# Patient Record
Sex: Male | Born: 1990 | Race: Black or African American | Marital: Single | Smoking: Never smoker
Health system: Southern US, Community
[De-identification: ages and names within clinical notes are randomized; demographics above are authoritative.]

---

## 2014-01-13 ENCOUNTER — Other Ambulatory Visit: Payer: Self-pay | Admitting: Family Medicine

## 2014-01-13 DIAGNOSIS — M84374D Stress fracture, right foot, subsequent encounter for fracture with routine healing: Secondary | ICD-10-CM

## 2014-01-13 DIAGNOSIS — M79671 Pain in right foot: Secondary | ICD-10-CM

## 2014-01-15 ENCOUNTER — Ambulatory Visit
Admission: RE | Admit: 2014-01-15 | Discharge: 2014-01-15 | Disposition: A | Discharge status: Home / Self Care | Payer: BC Managed Care – PPO | Source: Ambulatory Visit | Attending: Family Medicine | Admitting: Family Medicine

## 2014-01-15 DIAGNOSIS — M84374D Stress fracture, right foot, subsequent encounter for fracture with routine healing: Secondary | ICD-10-CM

## 2014-01-15 DIAGNOSIS — M79671 Pain in right foot: Secondary | ICD-10-CM

## 2014-02-05 ENCOUNTER — Other Ambulatory Visit: Payer: Self-pay | Admitting: Family Medicine

## 2014-02-05 DIAGNOSIS — M79671 Pain in right foot: Secondary | ICD-10-CM

## 2014-02-11 ENCOUNTER — Encounter (INDEPENDENT_AMBULATORY_CARE_PROVIDER_SITE_OTHER): Payer: Self-pay

## 2014-02-11 ENCOUNTER — Ambulatory Visit
Admission: RE | Admit: 2014-02-11 | Discharge: 2014-02-11 | Disposition: A | Discharge status: Home / Self Care | Payer: BC Managed Care – PPO | Source: Ambulatory Visit | Attending: Family Medicine | Admitting: Family Medicine

## 2014-02-11 DIAGNOSIS — M79671 Pain in right foot: Secondary | ICD-10-CM

## 2015-07-24 IMAGING — CT CT FOOT*R* W/O CM
4 of 5 series · 10 of 20 positions shown, 11 images · non-contrast
Comparison: 11/18/2013.

CLINICAL DATA: Navicular stress fracture. Running injury. Pain and
tenderness in the medial foot.

EXAM:
CT OF THE RIGHT FOOT WITHOUT CONTRAST
TECHNIQUE: Multidetector CT imaging was performed according to the standard
protocol. Multiplanar CT image reconstructions were also generated.

[Series 2: rt foot bone · axial · 0.54mm/px · z∈[-239,-96]mm · 3 of 58 slices shown, 4 images]
[im 1/58  soft-tissue]
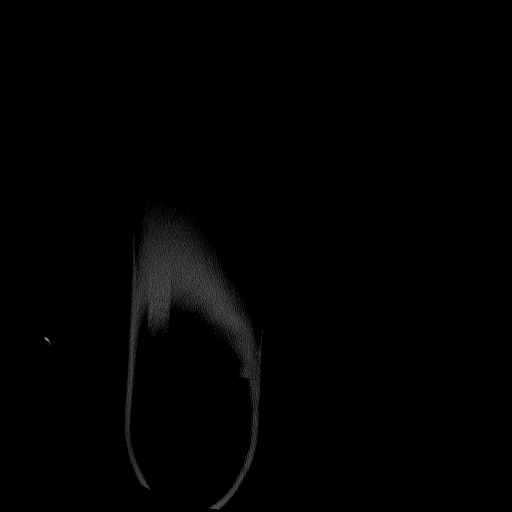
[im 1/58  bone]
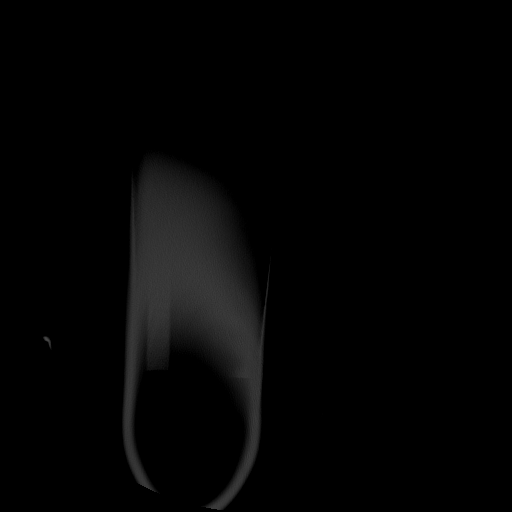
[im 29/58  bone]
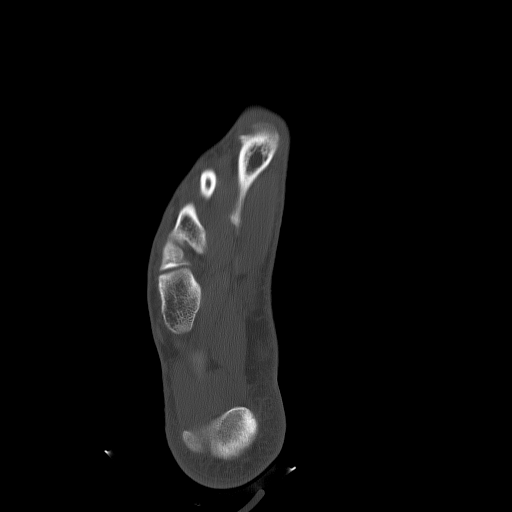
[im 58/58  bone]
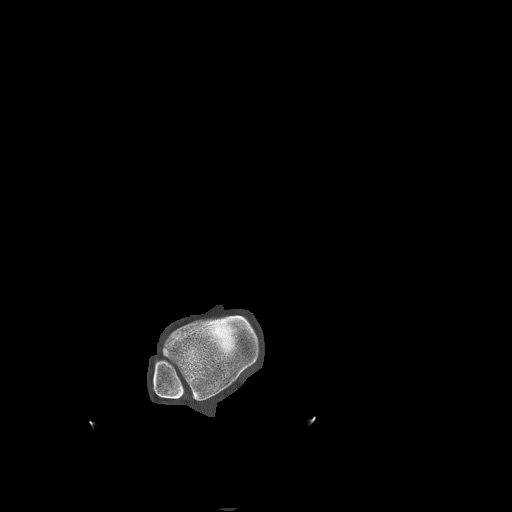

[Series 103: cor forefoot soft · axial · 0.54mm/px · z∈[-275,-237]mm · 2 of 66 slices shown]
[im 22/66  soft-tissue]
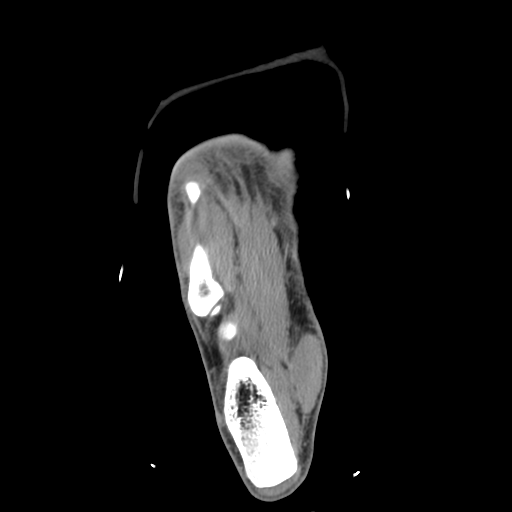
[im 44/66  soft-tissue]
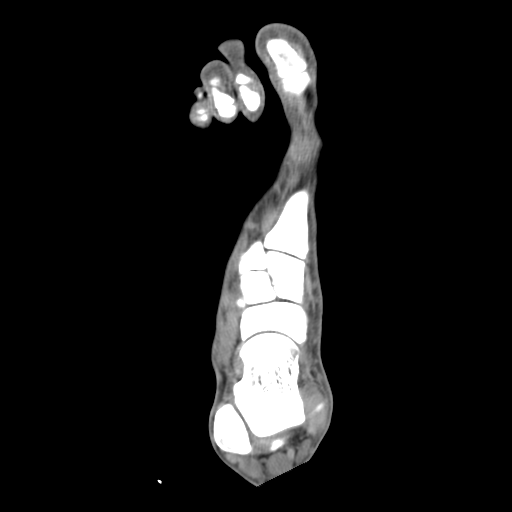

[Series 104: axial forefoot soft · coronal · 0.54mm/px · 3 of 127 slices shown]
[im 39/127  bone]
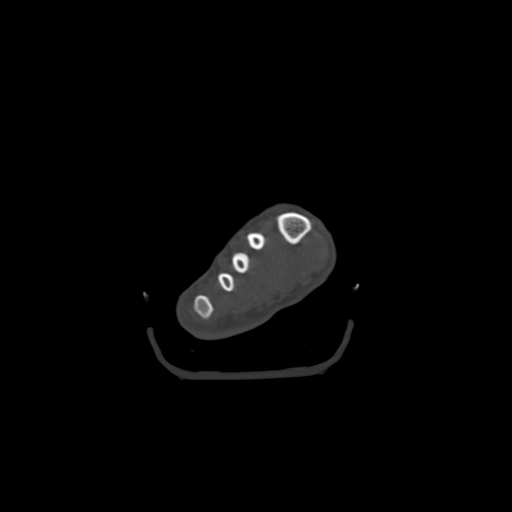
[im 55/127  bone]
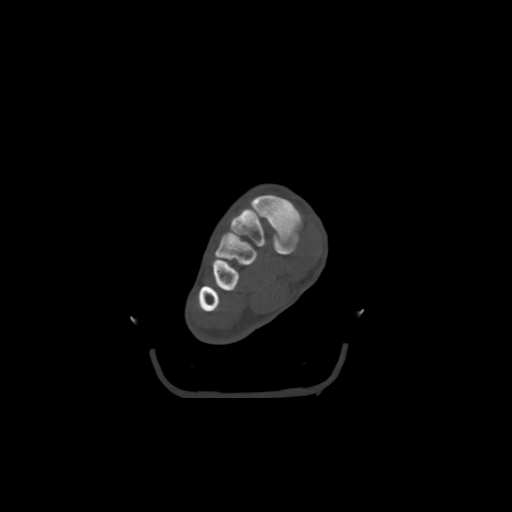
[im 72/127  bone]
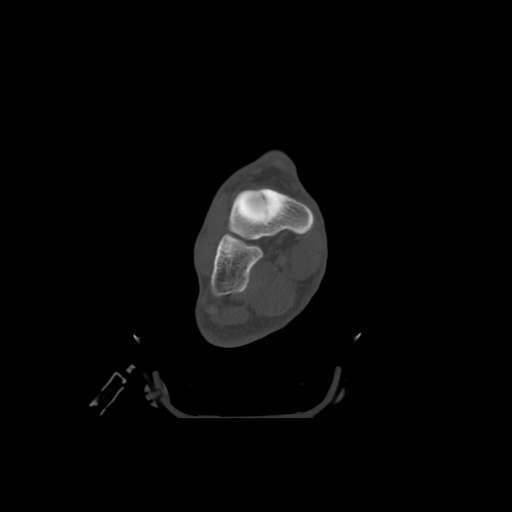

[Series 105: sag forefoot soft · sagittal · 0.54mm/px · 2 of 65 slices shown]
[im 22/65  soft-tissue]
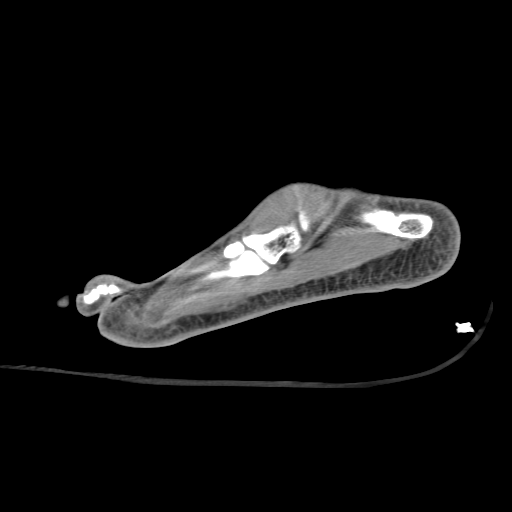
[im 43/65  soft-tissue]
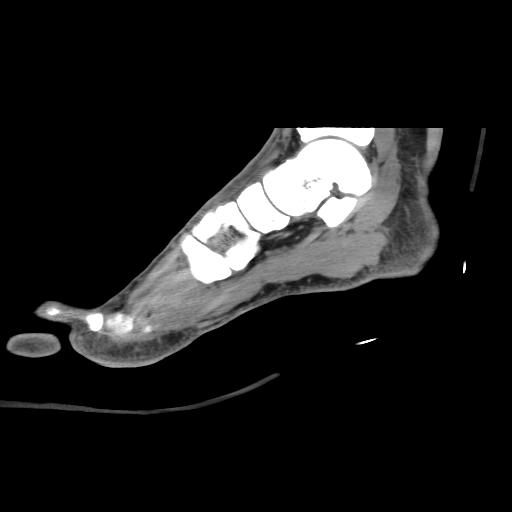

[10 of 20 positions shown; findings below may reference images not displayed]

FINDINGS: The navicular bone fracture shows expected healing. The fracture
plane remains faintly visible. Minimal cortical step-off deformity
is present at the talonavicular joint. Navicular cuneiform joints
appear within normal limits. Achilles tendon, peroneal tendons and
posterior medial tendons appear normal. Anterior tendon group
appears normal. Visualize metatarsals are within normal limits.
Bipartite medial sesamoid of the great toe is incidentally noted.
IMPRESSION: Expected healing of navicular stress fracture. The fracture plane
remains visible, particularly along the dorsal aspect of the
navicular bone however there is developing bridging bone at the
fracture site.

## 2015-08-20 IMAGING — CT CT FOOT*R* W/O CM
4 of 6 series · 10 of 29 positions shown, 11 images · non-contrast
Comparison: CT right foot- 01/15/2014 ; MRI right foot -11/18/2013

CLINICAL DATA: Followup navicular stress fracture

EXAM:
CT OF THE RIGHT FOOT WITHOUT CONTRAST
TECHNIQUE: Multidetector CT imaging of the right foot was performed according
to the standard protocol. Multiplanar CT image reconstructions were
also generated.

[Series 5: lower ext soft · axial · 0.61mm/px · z∈[-32,+13]mm · 2 of 56 slices shown]
[im 19/56  soft-tissue]
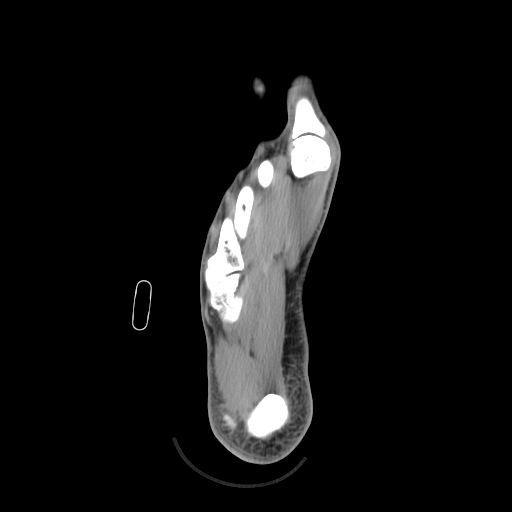
[im 37/56  soft-tissue]
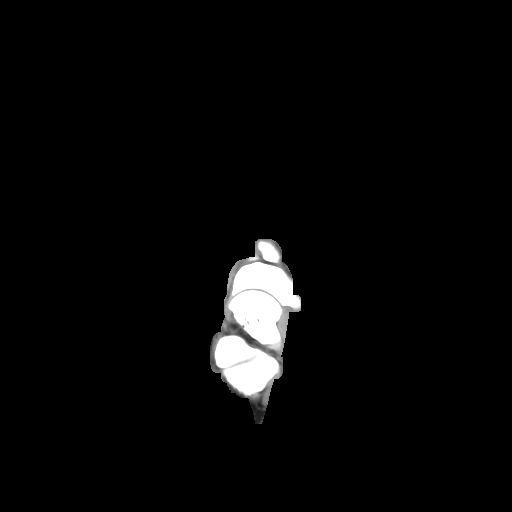

[Series 200: sag bone · sagittal · 0.61mm/px · 2 of 61 slices shown]
[im 22/61  soft-tissue]
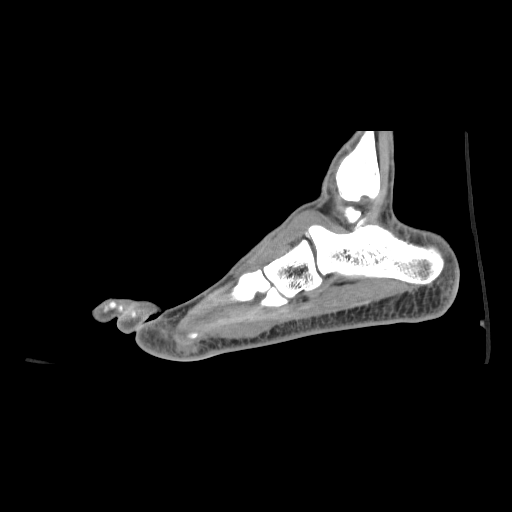
[im 31/61  bone]
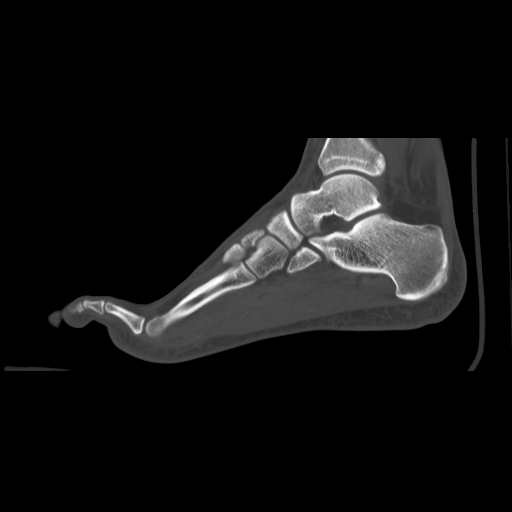

[Series 201: cor bone · axial · 0.61mm/px · z∈[-93,-45]mm · 3 of 54 slices shown, 4 images]
[im 14/54  soft-tissue]
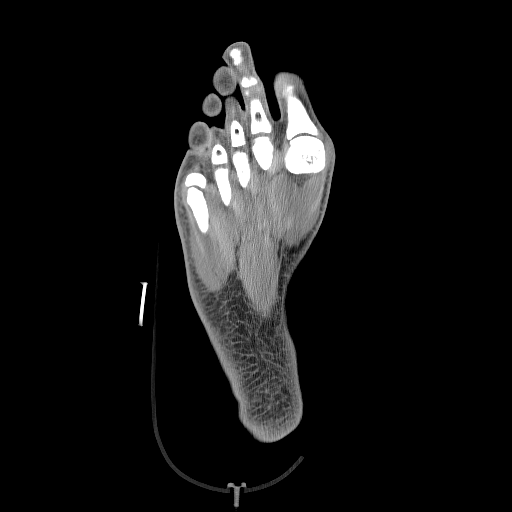
[im 14/54  bone]
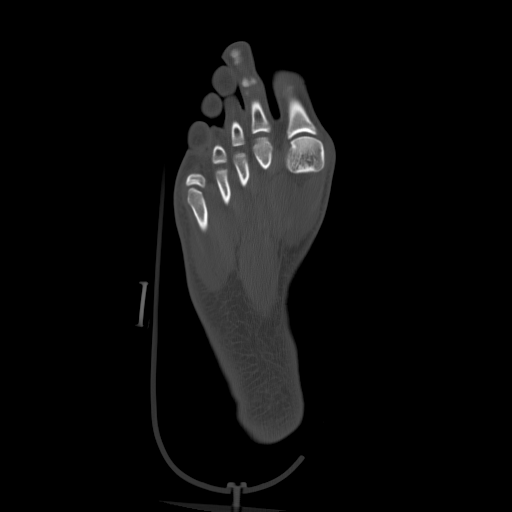
[im 27/54  bone]
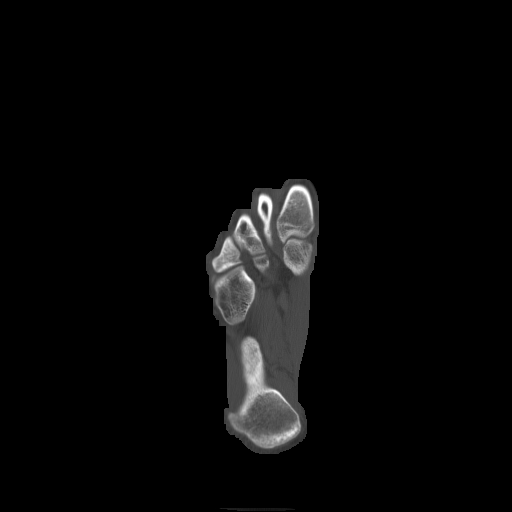
[im 40/54  bone]
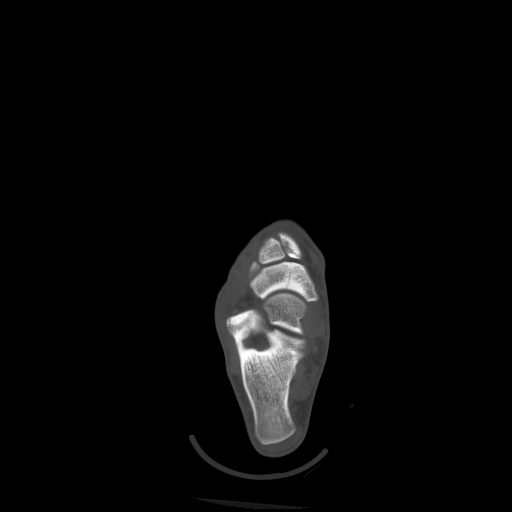

[Series 302: cor soft · axial · 0.61mm/px · z∈[-83,-29]mm · 3 of 56 slices shown]
[im 14/56  soft-tissue]
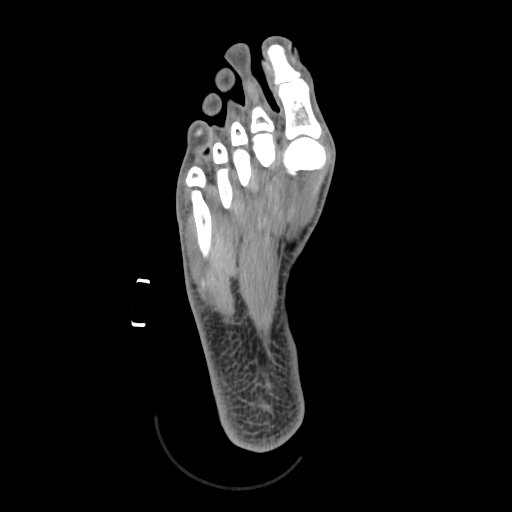
[im 28/56  soft-tissue]
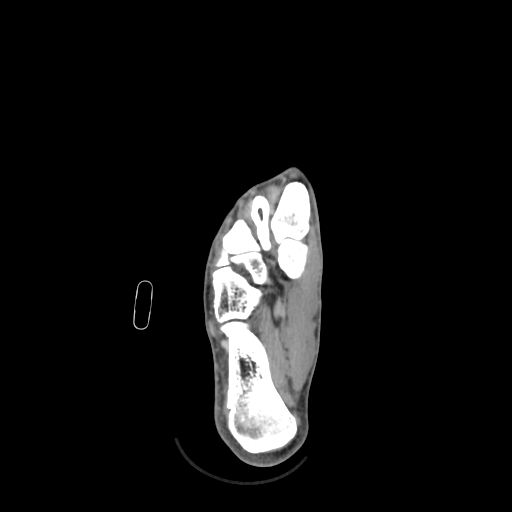
[im 42/56  soft-tissue]
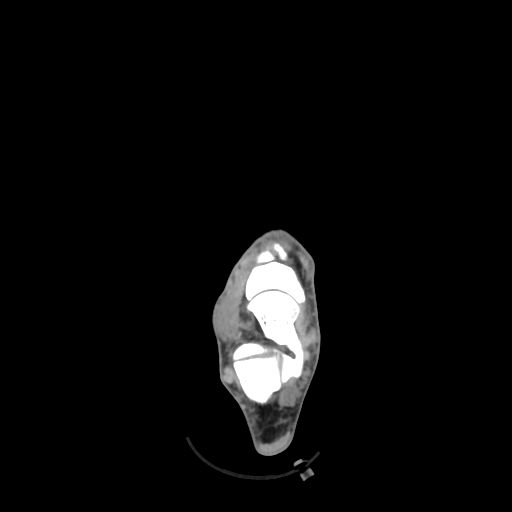

[10 of 29 positions shown; findings below may reference images not displayed]

FINDINGS: No significant interval change in the nondisplaced fracture through
the midportion of the dorsal navicular.

There is no other fracture or dislocation. The alignment is normal.
There is a multipartite medial hallux sesamoid. The joint spaces are
maintained. There is no lytic or sclerotic osseous lesion. There is
no fluid collection, hematoma or soft tissue mass. The visualized
portions of the flexor, extensor, peroneal and Achilles tendons are
grossly intact.
IMPRESSION: Nonunion of the dorsal mid navicular. No significant interval change
compared with 01/15/2014. The fracture demonstrates no significant
interval change when correlated with MRI dated 11/18/2013.

## 2015-09-08 ENCOUNTER — Encounter (HOSPITAL_COMMUNITY): Payer: Self-pay | Admitting: Family Medicine

## 2015-09-08 ENCOUNTER — Emergency Department (HOSPITAL_COMMUNITY)
Admission: EM | Admit: 2015-09-08 | Discharge: 2015-09-08 | Disposition: A | Discharge status: Home / Self Care | Payer: BLUE CROSS/BLUE SHIELD | Attending: Emergency Medicine | Admitting: Emergency Medicine

## 2015-09-08 DIAGNOSIS — J029 Acute pharyngitis, unspecified: Secondary | ICD-10-CM | POA: Diagnosis present

## 2015-09-08 DIAGNOSIS — E860 Dehydration: Secondary | ICD-10-CM | POA: Insufficient documentation

## 2015-09-08 DIAGNOSIS — J020 Streptococcal pharyngitis: Secondary | ICD-10-CM

## 2015-09-08 LAB — RAPID STREP SCREEN (MED CTR MEBANE ONLY): Streptococcus, Group A Screen (Direct): NEGATIVE

## 2015-09-08 MED ORDER — DEXAMETHASONE SODIUM PHOSPHATE 10 MG/ML IJ SOLN
10.0000 mg | Freq: Once | INTRAMUSCULAR | Status: AC
  Administered 2015-09-08: 10 mg via INTRAMUSCULAR
  Filled 2015-09-08: qty 1

## 2015-09-08 MED ORDER — PENICILLIN G BENZATHINE 1200000 UNIT/2ML IM SUSP
1.2000 10*6.[IU] | Freq: Once | INTRAMUSCULAR | Status: AC
  Administered 2015-09-08: 1.2 10*6.[IU] via INTRAMUSCULAR
  Filled 2015-09-08: qty 2

## 2015-09-08 MED ORDER — IBUPROFEN 400 MG PO TABS
600.0000 mg | ORAL_TABLET | Freq: Once | ORAL | Status: AC
  Administered 2015-09-08: 600 mg via ORAL
  Filled 2015-09-08: qty 1

## 2015-09-08 MED ORDER — ACETAMINOPHEN 325 MG PO TABS
650.0000 mg | ORAL_TABLET | Freq: Once | ORAL | Status: AC
  Administered 2015-09-08: 650 mg via ORAL
  Filled 2015-09-08: qty 2

## 2015-09-08 NOTE — ED Notes (Signed)
Declined W/C at D/C and was escorted to lobby by RN. 

## 2015-09-08 NOTE — ED Provider Notes (Signed)
CSN: [id number]     Arrival date & time 09/08/15  1209 History  By signing my name below, I, Emmanuella Mensah, attest that this documentation has been prepared under the direction and in the presence of [employer], PA-C. Electronically Signed: Angelene Giovanni, ED Scribe. 09/08/2015. 12:54 PM.     Chief Complaint  Patient presents with  . Sore Throat   The history is provided by the patient. No language interpreter was used.   HPI Comments: Zachary Parks is a 25 y.o. male who presents to the Emergency Department complaining of gradually worsening moderat sore throat onset 3 days ago. He reports associated fever, chills, and trouble swallowing. He adds that he has not been able to eat since onset. Pt states that he has been taking Nyquil and cough drops with no relief. No sick contacts noted. Pt reports NKDA. He denies any cough, rash, CP, n/v, or ear pain.    History reviewed. No pertinent past medical history. History reviewed. No pertinent past surgical history. History reviewed. No pertinent family history. Social History  Substance Use Topics  . Smoking status: Never Smoker   . Smokeless tobacco: None  . Alcohol Use: None    Review of Systems  Constitutional: Positive for fever and chills.  HENT: Positive for sore throat and trouble swallowing. Negative for ear pain.   Respiratory: Negative for cough.   Cardiovascular: Negative for chest pain.  Gastrointestinal: Negative for nausea and vomiting.  Skin: Negative for rash.      Allergies  Review of patient's allergies indicates no known allergies.  Home Medications   Prior to Admission medications   Not on File   BP 144/96 mmHg  Pulse 97  Temp(Src) 100.3 F (37.9 C) (Oral)  Resp 18  Wt 185 lb (83.915 kg)  SpO2 100% Physical Exam  Constitutional: He is oriented to person, place, and time. He appears well-developed and well-nourished. No distress.  HENT:  Head: Normocephalic and atraumatic.  Mouth/Throat:  Uvula is midline and mucous membranes are normal. No trismus in the jaw. No uvula swelling. Oropharyngeal exudate, posterior oropharyngeal edema and posterior oropharyngeal erythema present. No tonsillar abscesses.  Eyes: Conjunctivae are normal. Right eye exhibits no discharge. Left eye exhibits no discharge. No scleral icterus.  Cardiovascular: Normal rate.   Pulmonary/Chest: Effort normal.  Neurological: He is alert and oriented to person, place, and time. Coordination normal.  Skin: Skin is warm and dry. No rash noted. He is not diaphoretic. No erythema. No pallor.  Psychiatric: He has a normal mood and affect. His behavior is normal.  Nursing note and vitals reviewed.   ED Course  Procedures (including critical care time) DIAGNOSTIC STUDIES: Oxygen Saturation is 100% on RA, normal by my interpretation.    COORDINATION OF CARE: 12:33 PM- Pt advised of plan for treatment and pt agrees. Pt will receive rapid strep screen for further evaluation. He will receive Penicillin IM, decadron IM, and ibuprofen as needed.    Labs Review Labs Reviewed  RAPID STREP SCREEN (NOT AT [facility])  CULTURE, GROUP A STREP [facility])    Gaylyn Rong, PA-C has personally reviewed and evaluated these lab results as part of her medical decision-making.  MDM   Final diagnoses:  Strep pharyngitis  Pt febrile with tonsillar exudate, cervical lymphadenopathy, & dysphagia; diagnosis of strep. Treated in the Ed with steroids, NSAIDs, Pain medication and PCN IM.  Pt appears mildly dehydrated, discussed importance of water rehydration. Presentation non concerning for PTA or infxn spread to soft tissue.  No trismus or uvula deviation. Specific return precautions discussed. Pt able to drink water in ED without difficulty with intact air way. Recommended PCP follow up.     I personally performed the services described in this documentation, which was scribed in my presence. The recorded information has been reviewed and  is accurate.     Lester KinsmanSamantha Tripp AtkinsDowless, PA-C 09/08/15 1326  Gwyneth SproutWhitney Plunkett, MD 09/08/15 2112

## 2015-09-08 NOTE — ED Notes (Signed)
Pt here for sore throat since Sunday. Pt fever, chills.

## 2015-09-08 NOTE — Discharge Instructions (Signed)
Strep Throat °Strep throat is a bacterial infection of the throat. Your health care provider may call the infection tonsillitis or pharyngitis, depending on whether there is swelling in the tonsils or at the back of the throat. Strep throat is most common during the cold months of the year in children who are 5-23 years of age, but it can happen during any season in people of any age. This infection is spread from person to person (contagious) through coughing, sneezing, or close contact. °CAUSES °Strep throat is caused by the bacteria called Streptococcus pyogenes. °RISK FACTORS °This condition is more likely to develop in: °· People who spend time in crowded places where the infection can spread easily. °· People who have close contact with someone who has strep throat. °SYMPTOMS °Symptoms of this condition include: °· Fever or chills.   °· Redness, swelling, or pain in the tonsils or throat. °· Pain or difficulty when swallowing. °· White or yellow spots on the tonsils or throat. °· Swollen, tender glands in the neck or under the jaw. °· Red rash all over the body (rare). °DIAGNOSIS °This condition is diagnosed by performing a rapid strep test or by taking a swab of your throat (throat culture test). Results from a rapid strep test are usually ready in a few minutes, but throat culture test results are available after one or two days. °TREATMENT °This condition is treated with antibiotic medicine. °HOME CARE INSTRUCTIONS °Medicines °· Take over-the-counter and prescription medicines only as told by your health care provider. °· Take your antibiotic as told by your health care provider. Do not stop taking the antibiotic even if you start to feel better. °· Have family members who also have a sore throat or fever tested for strep throat. They may need antibiotics if they have the strep infection. °Eating and Drinking °· Do not share food, drinking cups, or personal items that could cause the infection to spread to  other people. °· If swallowing is difficult, try eating soft foods until your sore throat feels better. °· Drink enough fluid to keep your urine clear or pale yellow. °General Instructions °· Gargle with a salt-water mixture 3-4 times per day or as needed. To make a salt-water mixture, completely dissolve ½-1 tsp of salt in 1 cup of warm water. °· Make sure that all household members wash their hands well. °· Get plenty of rest. °· Stay home from school or work until you have been taking antibiotics for 24 hours. °· Keep all follow-up visits as told by your health care provider. This is important. °SEEK MEDICAL CARE IF: °· The glands in your neck continue to get bigger. °· You develop a rash, cough, or earache. °· You cough up a thick liquid that is green, yellow-brown, or bloody. °· You have pain or discomfort that does not get better with medicine. °· Your problems seem to be getting worse rather than better. °· You have a fever. °SEEK IMMEDIATE MEDICAL CARE IF: °· You have new symptoms, such as vomiting, severe headache, stiff or painful neck, chest pain, or shortness of breath. °· You have severe throat pain, drooling, or changes in your voice. °· You have swelling of the neck, or the skin on the neck becomes red and tender. °· You have signs of dehydration, such as fatigue, dry mouth, and decreased urination. °· You become increasingly sleepy, or you cannot wake up completely. °· Your joints become red or painful. °  °This information is not intended to replace   advice given to you by your health care provider. Make sure you discuss any questions you have with your health care provider.  Follow up with your primary care provider if your symptoms do not improve. Encourage use of Chloraseptic spray for throat analgesia. May take ibuprofen for fever and throat pain. Return to the ER if you experience difficulty breathing, difficulty swallowing, increased fever, rash.

## 2015-09-11 LAB — CULTURE, GROUP A STREP (THRC)

## 2015-09-13 ENCOUNTER — Encounter (HOSPITAL_COMMUNITY): Payer: Self-pay | Admitting: Adult Health

## 2015-09-13 DIAGNOSIS — R1010 Upper abdominal pain, unspecified: Secondary | ICD-10-CM | POA: Diagnosis present

## 2015-09-13 DIAGNOSIS — J020 Streptococcal pharyngitis: Secondary | ICD-10-CM | POA: Diagnosis not present

## 2015-09-13 DIAGNOSIS — R1110 Vomiting, unspecified: Secondary | ICD-10-CM | POA: Insufficient documentation

## 2015-09-13 LAB — COMPREHENSIVE METABOLIC PANEL
ALBUMIN: 4 g/dL (ref 3.5–5.0)
ALK PHOS: 48 U/L (ref 38–126)
ALT: 27 U/L (ref 17–63)
ANION GAP: 10 (ref 5–15)
AST: 30 U/L (ref 15–41)
BILIRUBIN TOTAL: 0.6 mg/dL (ref 0.3–1.2)
BUN: 14 mg/dL (ref 6–20)
CALCIUM: 9.4 mg/dL (ref 8.9–10.3)
CO2: 27 mmol/L (ref 22–32)
Chloride: 103 mmol/L (ref 101–111)
Creatinine, Ser: 1.06 mg/dL (ref 0.61–1.24)
GFR calc Af Amer: >60 mL/min (ref >60)
GFR calc non Af Amer: >60 mL/min (ref >60)
GLUCOSE: 96 mg/dL (ref 65–99)
Potassium: 3.7 mmol/L (ref 3.5–5.1)
Sodium: 140 mmol/L (ref 135–145)
TOTAL PROTEIN: 7.6 g/dL (ref 6.5–8.1)

## 2015-09-13 LAB — LIPASE, BLOOD: Lipase: 29 U/L (ref 11–51)

## 2015-09-13 LAB — CBC
HCT: 39.4 % (ref 39.0–52.0)
Hemoglobin: 13.1 g/dL (ref 13.0–17.0)
MCH: 28.9 pg (ref 26.0–34.0)
MCHC: 33.2 g/dL (ref 30.0–36.0)
MCV: 86.8 fL (ref 78.0–100.0)
PLATELETS: 192 10*3/uL (ref 150–400)
RBC: 4.54 MIL/uL (ref 4.22–5.81)
RDW: 13 % (ref 11.5–15.5)
WBC: 12.5 10*3/uL — ABNORMAL HIGH (ref 4.0–10.5)

## 2015-09-13 NOTE — ED Notes (Signed)
Presents with upper abdominal pain with radiation into beack began this AM associated with emesis x1. Pain comes and go and is described as cramping. Nothing makes pain better. Pain occurred last week after coming home from here with Strep throat, but pain went away at that time. Today pain is much worse and not going away.

## 2015-09-14 ENCOUNTER — Emergency Department (HOSPITAL_COMMUNITY)
Admission: EM | Admit: 2015-09-14 | Discharge: 2015-09-14 | Disposition: A | Discharge status: Home / Self Care | Payer: BLUE CROSS/BLUE SHIELD | Attending: Emergency Medicine | Admitting: Emergency Medicine

## 2015-09-14 NOTE — ED Notes (Signed)
Pt did not answer when name called

## 2015-12-12 ENCOUNTER — Other Ambulatory Visit: Payer: Self-pay | Admitting: Family Medicine
